# Patient Record
Sex: Male | Born: 2000 | Race: Black or African American | Hispanic: No | Marital: Single
Health system: Southern US, Community
[De-identification: ages and names within clinical notes are randomized; demographics above are authoritative.]

---
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
## 2022-02-03 IMAGING — DX DG TIBIA/FIBULA 2V*R*
2 series · 2 of 2 positions shown · non-contrast
Comparison: 08/01/2020

CLINICAL DATA: Right fibular fracture.  Subsequent encounter.

EXAM:
RIGHT TIBIA AND FIBULA - 2 VIEW

[dg tibia/fibula right (1 of 2)]
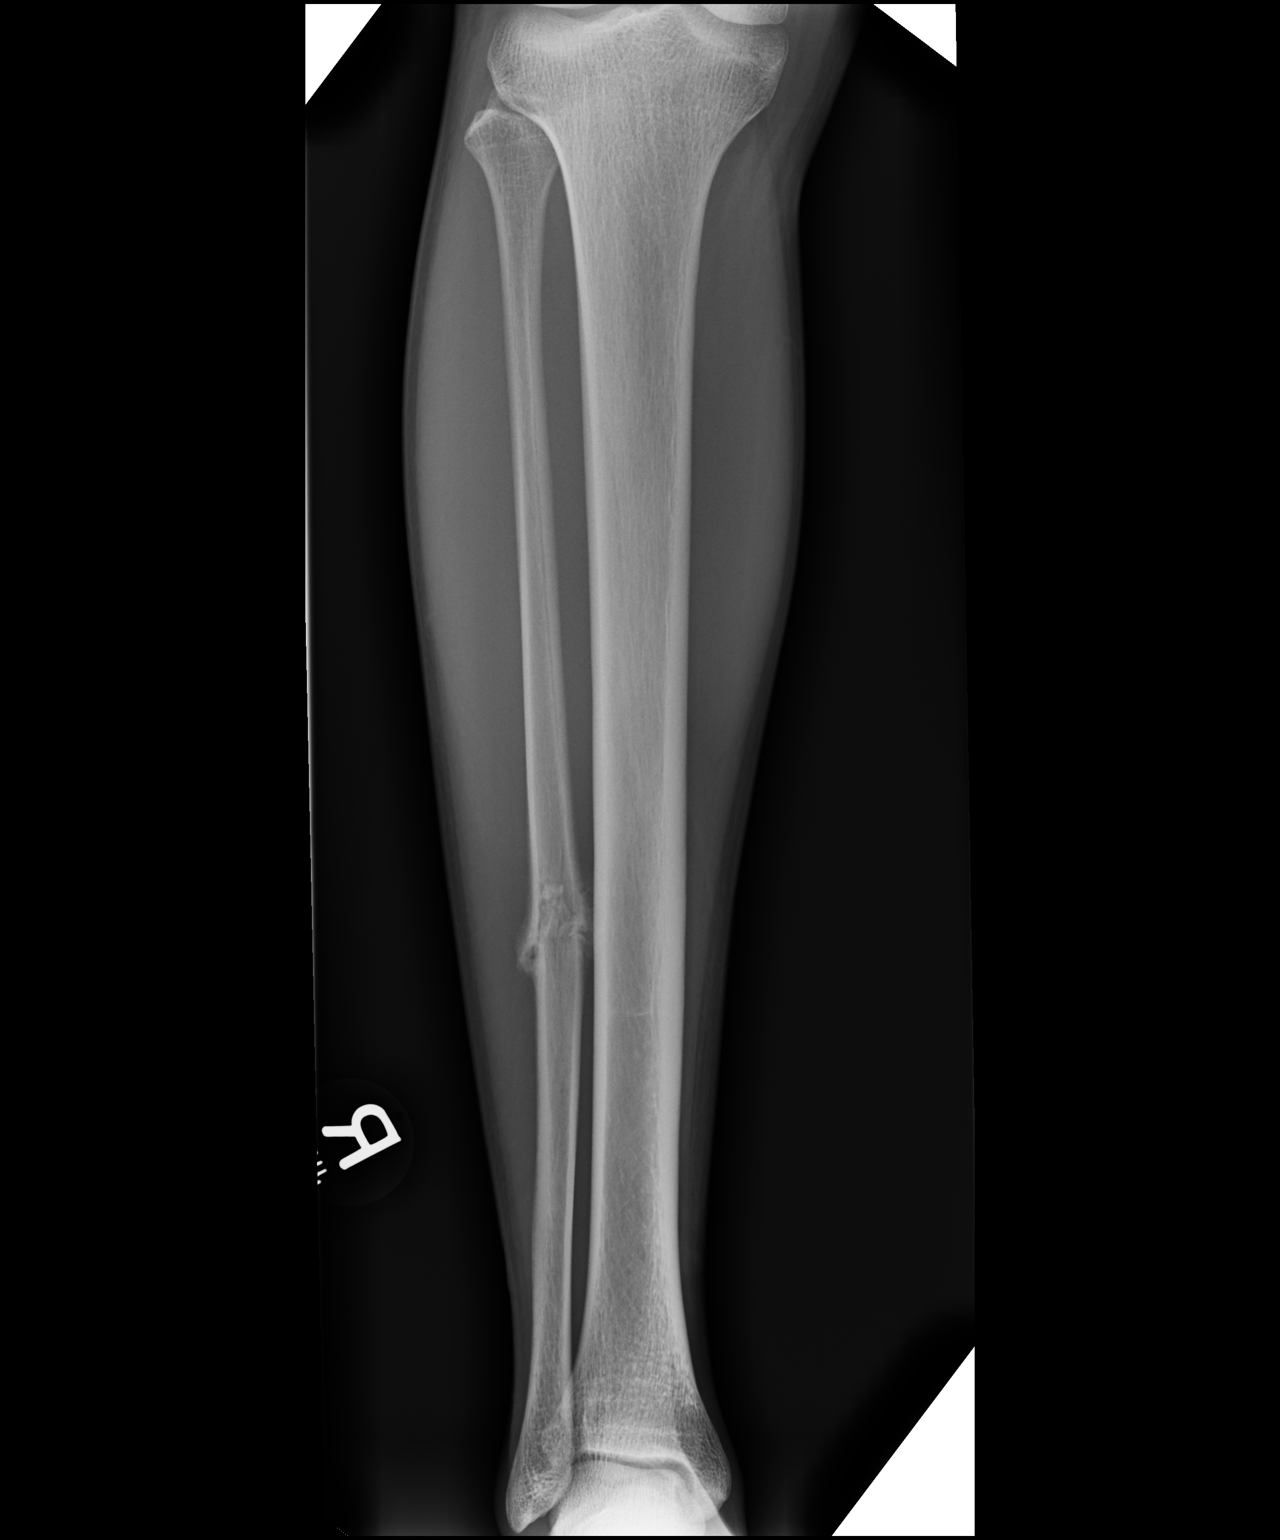

[dg tibia/fibula right (2 of 2)]
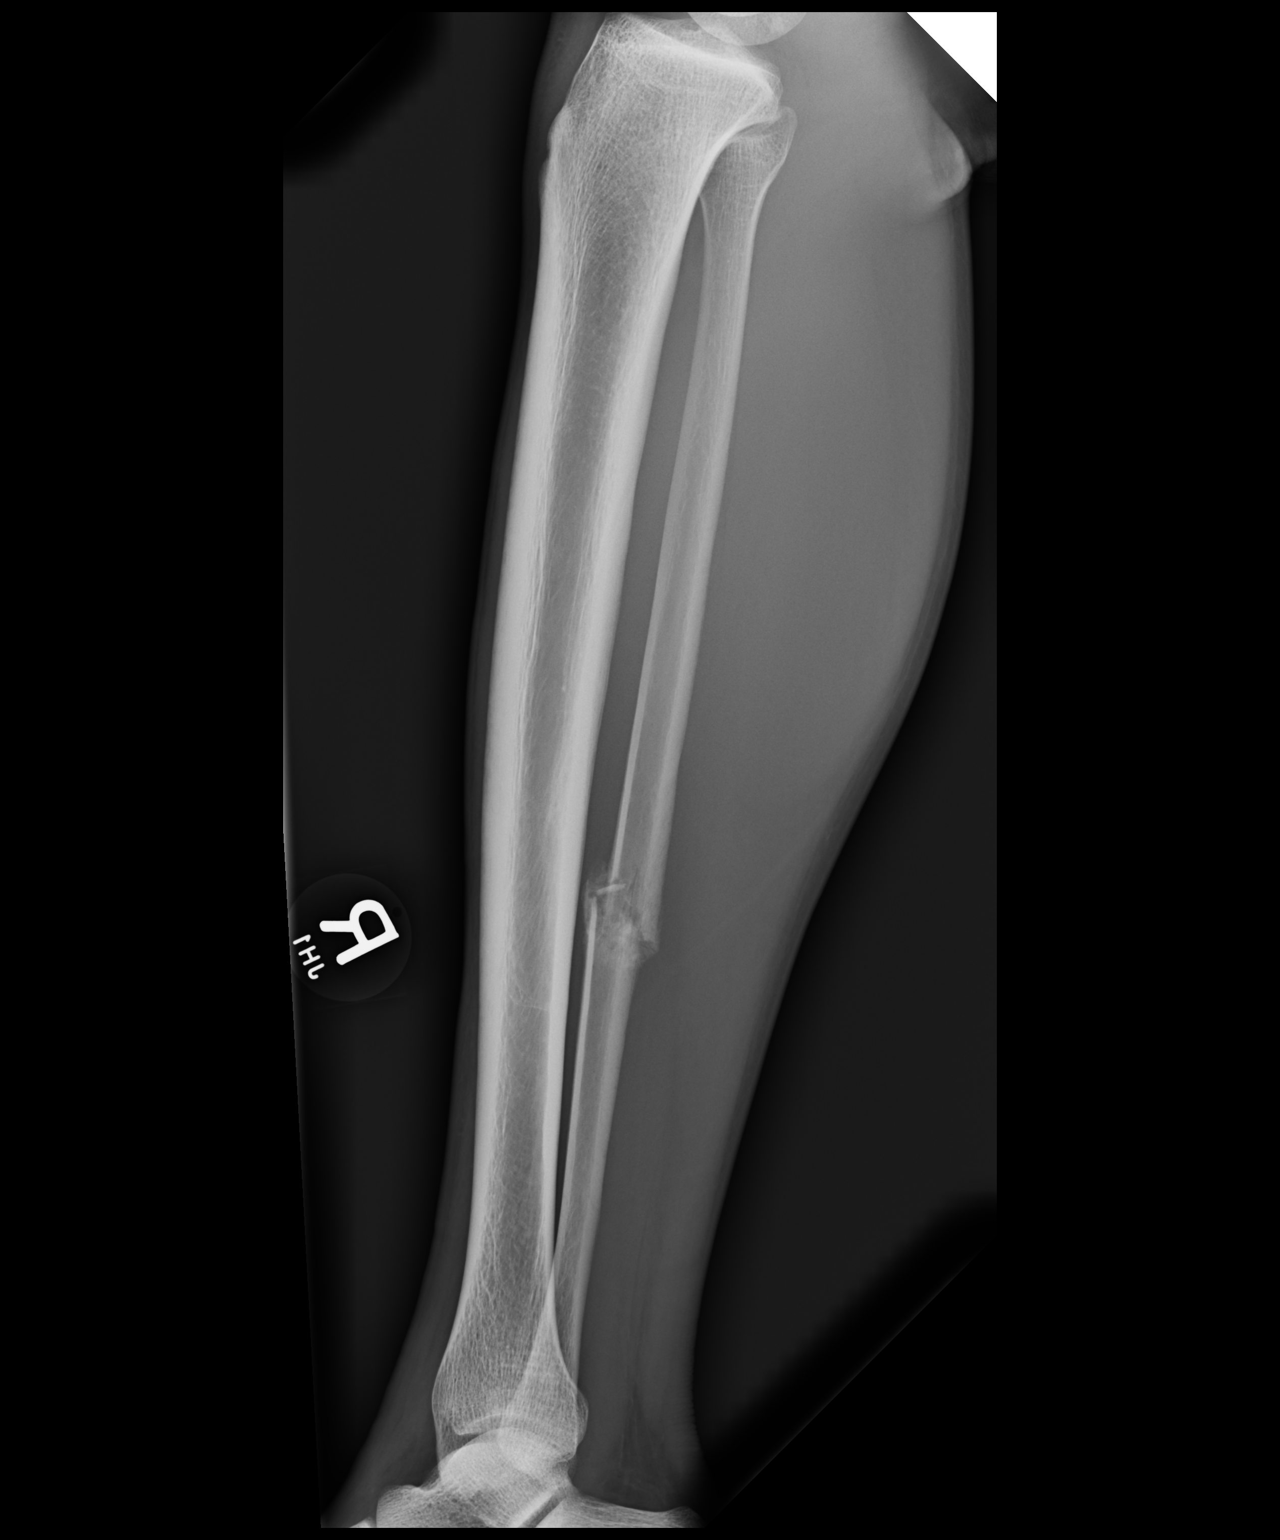

[2 of 2 positions shown; findings below may reference images not displayed]

FINDINGS: Interval incomplete bridging callus formation about the midshaft
fibular fracture. The fracture is in unchanged alignment with mild
residual displacement. No new fracture. Tibia is intact. Knee and
ankle alignment are maintained.
IMPRESSION: Healing of the midshaft fibular fracture in unchanged alignment.

## 2023-03-15 ENCOUNTER — Telehealth: Payer: Self-pay

## 2023-03-15 NOTE — Telephone Encounter (Signed)
LVM for patient to call back. AS, CMA 

## 2023-10-11 ENCOUNTER — Ambulatory Visit (HOSPITAL_COMMUNITY): Payer: Medicaid Other

## 2024-06-27 DIAGNOSIS — R369 Urethral discharge, unspecified: Secondary | ICD-10-CM | POA: Diagnosis not present

## 2024-06-27 DIAGNOSIS — A749 Chlamydial infection, unspecified: Secondary | ICD-10-CM | POA: Diagnosis not present

## 2024-07-07 DIAGNOSIS — A64 Unspecified sexually transmitted disease: Secondary | ICD-10-CM | POA: Diagnosis not present

## 2024-07-07 DIAGNOSIS — Z113 Encounter for screening for infections with a predominantly sexual mode of transmission: Secondary | ICD-10-CM | POA: Diagnosis not present

## 2024-10-03 DIAGNOSIS — Z202 Contact with and (suspected) exposure to infections with a predominantly sexual mode of transmission: Secondary | ICD-10-CM | POA: Diagnosis not present

## 2024-10-03 DIAGNOSIS — A64 Unspecified sexually transmitted disease: Secondary | ICD-10-CM | POA: Diagnosis not present
